# Patient Record
Sex: Female | Born: 1995 | Race: Black or African American | Hispanic: No | Marital: Single | Smoking: Never smoker
Health system: Southern US, Community
[De-identification: ages and names within clinical notes are randomized; demographics above are authoritative.]

## PROBLEM LIST (undated history)

## (undated) DIAGNOSIS — R569 Unspecified convulsions: Secondary | ICD-10-CM

## (undated) DIAGNOSIS — L709 Acne, unspecified: Secondary | ICD-10-CM

---
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
## 2015-03-16 ENCOUNTER — Other Ambulatory Visit: Payer: Self-pay

## 2015-04-29 ENCOUNTER — Other Ambulatory Visit: Payer: Self-pay | Admitting: Sports Medicine

## 2015-04-29 DIAGNOSIS — M25562 Pain in left knee: Secondary | ICD-10-CM

## 2015-05-17 ENCOUNTER — Ambulatory Visit
Admission: RE | Admit: 2015-05-17 | Discharge: 2015-05-17 | Disposition: A | Discharge status: Home / Self Care | Payer: Medicaid Other | Source: Ambulatory Visit | Attending: Sports Medicine | Admitting: Sports Medicine

## 2015-05-17 DIAGNOSIS — M25562 Pain in left knee: Secondary | ICD-10-CM

## 2015-05-21 ENCOUNTER — Emergency Department (HOSPITAL_COMMUNITY): Payer: Medicaid Other

## 2015-05-21 ENCOUNTER — Emergency Department (HOSPITAL_COMMUNITY)
Admission: EM | Admit: 2015-05-21 | Discharge: 2015-05-21 | Disposition: A | Discharge status: Home / Self Care | Payer: Medicaid Other | Attending: Emergency Medicine | Admitting: Emergency Medicine

## 2015-05-21 ENCOUNTER — Encounter (HOSPITAL_COMMUNITY): Payer: Self-pay | Admitting: *Deleted

## 2015-05-21 DIAGNOSIS — Z872 Personal history of diseases of the skin and subcutaneous tissue: Secondary | ICD-10-CM | POA: Insufficient documentation

## 2015-05-21 DIAGNOSIS — Z3202 Encounter for pregnancy test, result negative: Secondary | ICD-10-CM | POA: Diagnosis not present

## 2015-05-21 DIAGNOSIS — R091 Pleurisy: Secondary | ICD-10-CM | POA: Diagnosis not present

## 2015-05-21 DIAGNOSIS — M546 Pain in thoracic spine: Secondary | ICD-10-CM | POA: Diagnosis present

## 2015-05-21 LAB — POC URINE PREG, ED: PREG TEST UR: NEGATIVE

## 2015-05-21 MED ORDER — DICLOFENAC SODIUM 75 MG PO TBEC: 75.0000 mg | DELAYED_RELEASE_TABLET | Freq: Two times a day (BID) | ORAL | Status: DC

## 2015-05-21 MED ORDER — CYCLOBENZAPRINE HCL 10 MG PO TABS: 10.0000 mg | ORAL_TABLET | Freq: Three times a day (TID) | ORAL | Status: DC | PRN

## 2015-05-21 MED ORDER — HYDROCODONE-ACETAMINOPHEN 5-325 MG PO TABS: 2.0000 | ORAL_TABLET | ORAL | Status: DC | PRN

## 2015-05-21 NOTE — ED Provider Notes (Signed)
CSN: [id number]     Arrival date & time 05/21/15  1328 History   First MD Initiated Contact with Patient 05/21/15 1444     Chief Complaint  Patient presents with  . Back Pain     (Consider location/radiation/quality/duration/timing/severity/associated sxs/prior Treatment) Patient is a 19 y.o. female presenting with back pain. The history is provided by the patient. No language interpreter was used.  Back Pain Location:  Generalized Quality:  Aching Radiates to:  Does not radiate Pain severity:  Moderate Onset quality:  Gradual Duration:  1 week Timing:  Constant Progression:  Worsening Chronicity:  New Context: not emotional stress   Relieved by:  Nothing Worsened by:  Deep breathing and movement Associated symptoms: no abdominal pain   Risk factors: not pregnant and no recent surgery     Past Medical History  Diagnosis Date  . Acne   . Seizures (HCC)     Unknown for dx x 1   History reviewed. No pertinent past surgical history. Family History  Problem Relation Age of Onset  . Hypertension Mother   . Pneumonia Father   . Cancer Paternal Grandmother     breast cancer   Social History  Substance Use Topics  . Smoking status: Never Smoker   . Smokeless tobacco: None  . Alcohol Use: No   OB History    Gravida Para Term Preterm AB TAB SAB Ectopic Multiple Living   0 0 0 0 0 0 0 0       Review of Systems  Gastrointestinal: Negative for abdominal pain.  Musculoskeletal: Positive for back pain.  All other systems reviewed and are negative.     Allergies  Peanut oil; Solodyn; and Peanuts  Home Medications   Prior to Admission medications   Medication Sig Start Date End Date Taking? Authorizing Provider  ibuprofen (ADVIL,MOTRIN) 200 MG tablet Take 200-400 mg by mouth every 6 (six) hours as needed for headache, mild pain or moderate pain.   Yes Historical Provider, MD  cyclobenzaprine (FLEXERIL) 10 MG tablet Take 1 tablet (10 mg total) by mouth 3 (three)  times daily as needed for muscle spasms. 05/21/15   Elson AreasLeslie K Sofia, PA-C  diclofenac (VOLTAREN) 75 MG EC tablet Take 1 tablet (75 mg total) by mouth 2 (two) times daily. 05/21/15   Elson AreasLeslie K Sofia, PA-C  HYDROcodone-acetaminophen (NORCO/VICODIN) 5-325 MG tablet Take 2 tablets by mouth every 4 (four) hours as needed. 05/21/15   Lonia SkinnerLeslie K Sofia, PA-C   BP 135/74 mmHg  Pulse 106  Temp(Src) 98.3 F (36.8 C) (Oral)  Resp 16  Ht 5\' 9"  (1.753 m)  Wt 149 lb (67.586 kg)  BMI 21.99 kg/m2  SpO2 100%  LMP 04/30/2015 Physical Exam  Constitutional: She is oriented to person, place, and time. She appears well-developed and well-nourished.  HENT:  Head: Normocephalic and atraumatic.  Eyes: Conjunctivae and EOM are normal. Pupils are equal, round, and reactive to light.  Neck: Normal range of motion.  Cardiovascular: Normal rate and normal heart sounds.   Pulmonary/Chest: Effort normal and breath sounds normal.  Abdominal: Soft. She exhibits no distension.  Musculoskeletal:  Tender left upper back, scapula and upper back,  Full range of motion neck,   Neck nontender,   Pain with movement of left shoulder.   Neurological: She is alert and oriented to person, place, and time.  Psychiatric: She has a normal mood and affect.  Nursing note and vitals reviewed.   ED Course  Procedures (including critical care time)  Labs Review Labs Reviewed  POC URINE PREG, ED    Imaging Review No results found. I have personally reviewed and evaluated these images and lab results as part of my medical decision-making.   EKG Interpretation None      MDM  Pt has had a recent cough,   I suspect pain may be pleuritic.     Final diagnoses:  Pleurisy    voltaren Hydrocodone Return if any problems. See your Physicain for recheck if symptoms persist    Elson Areas, PA-C 05/21/15 1555  Glynn Octave, MD 05/21/15 [phone]

## 2015-05-21 NOTE — Discharge Instructions (Signed)
Pleurisy Pleurisy is an inflammation and swelling of the lining of the lungs (pleura). Because of this inflammation, it hurts to breathe. It can be aggravated by coughing, laughing, or deep breathing. Pleurisy is often caused by an underlying infection or disease.  HOME CARE INSTRUCTIONS  Monitor your pleurisy for any changes. The following actions may help to alleviate any discomfort you are experiencing:  Medicine may help with pain. Only take over-the-counter or prescription medicines for pain, discomfort, or fever as directed by your health care provider.  Only take antibiotic medicine as directed. Make sure to finish it even if you start to feel better. SEEK MEDICAL CARE IF:   Your pain is not controlled with medicine or is increasing.  You have an increase in pus-like (purulent) secretions brought up with coughing. SEEK IMMEDIATE MEDICAL CARE IF:   You have blue or dark lips, fingernails, or toenails.  You are coughing up blood.  You have increased difficulty breathing.  You have continuing pain unrelieved by medicine or pain lasting more than 1 week.  You have pain that radiates into your neck, arms, or jaw.  You develop increased shortness of breath or wheezing.  You develop a fever, rash, vomiting, fainting, or other serious symptoms. MAKE SURE YOU:  Understand these instructions.   Will watch your condition.   Will get help right away if you are not doing well or get worse.    This information is not intended to replace advice given to you by your health care provider. Make sure you discuss any questions you have with your health care provider.   Document Released: 07/10/2005 Document Revised: 03/12/2013 Document Reviewed: 12/22/2012 Elsevier Interactive Patient Education 2016 [employer] Back Pain, Adult Back pain is very common in adults.The cause of back pain is rarely dangerous and the pain often gets better over time.The cause of your back pain may  not be known. Some common causes of back pain include:  Strain of the muscles or ligaments supporting the spine.  Wear and tear (degeneration) of the spinal disks.  Arthritis.  Direct injury to the back. For many people, back pain may return. Since back pain is rarely dangerous, most people can learn to manage this condition on their own. HOME CARE INSTRUCTIONS Watch your back pain for any changes. The following actions may help to lessen any discomfort you are feeling:  Remain active. It is stressful on your back to sit or stand in one place for long periods of time. Do not sit, drive, or stand in one place for more than 30 minutes at a time. Take short walks on even surfaces as soon as you are able.Try to increase the length of time you walk each day.  Exercise regularly as directed by your health care provider. Exercise helps your back heal faster. It also helps avoid future injury by keeping your muscles strong and flexible.  Do not stay in bed.Resting more than 1-2 days can delay your recovery.  Pay attention to your body when you bend and lift. The most comfortable positions are those that put less stress on your recovering back. Always use proper lifting techniques, including:  Bending your knees.  Keeping the load close to your body.  Avoiding twisting.  Find a comfortable position to sleep. Use a firm mattress and lie on your side with your knees slightly bent. If you lie on your back, put a pillow under your knees.  Avoid feeling anxious or stressed.Stress increases muscle tension and  can worsen back pain.It is important to recognize when you are anxious or stressed and learn ways to manage it, such as with exercise.  Take medicines only as directed by your health care provider. Over-the-counter medicines to reduce pain and inflammation are often the most helpful.Your health care provider may prescribe muscle relaxant drugs.These medicines help dull your pain so you can  more quickly return to your normal activities and healthy exercise.  Apply ice to the injured area:  Put ice in a plastic bag.  Place a towel between your skin and the bag.  Leave the ice on for 20 minutes, 2-3 times a day for the first 2-3 days. After that, ice and heat may be alternated to reduce pain and spasms.  Maintain a healthy weight. Excess weight puts extra stress on your back and makes it difficult to maintain good posture. SEEK MEDICAL CARE IF:  You have pain that is not relieved with rest or medicine.  You have increasing pain going down into the legs or buttocks.  You have pain that does not improve in one week.  You have night pain.  You lose weight.  You have a fever or chills. SEEK IMMEDIATE MEDICAL CARE IF:   You develop new bowel or bladder control problems.  You have unusual weakness or numbness in your arms or legs.  You develop nausea or vomiting.  You develop abdominal pain.  You feel faint.   This information is not intended to replace advice given to you by your health care provider. Make sure you discuss any questions you have with your health care provider.   Document Released: 07/10/2005 Document Revised: 07/31/2014 Document Reviewed: 11/11/2013 Elsevier Interactive Patient Education [employer]

## 2015-05-21 NOTE — ED Notes (Signed)
Left upper shoulder /back pain after having cough and cold symptoms last week. Movement makes the pain worse.

## 2016-08-27 IMAGING — DX DG CHEST 2V
3 series · 3 of 3 positions shown · non-contrast
Comparison: None.

CLINICAL DATA: Cough 1 week worsening.

EXAM:
CHEST  2 VIEW

[chest pa]
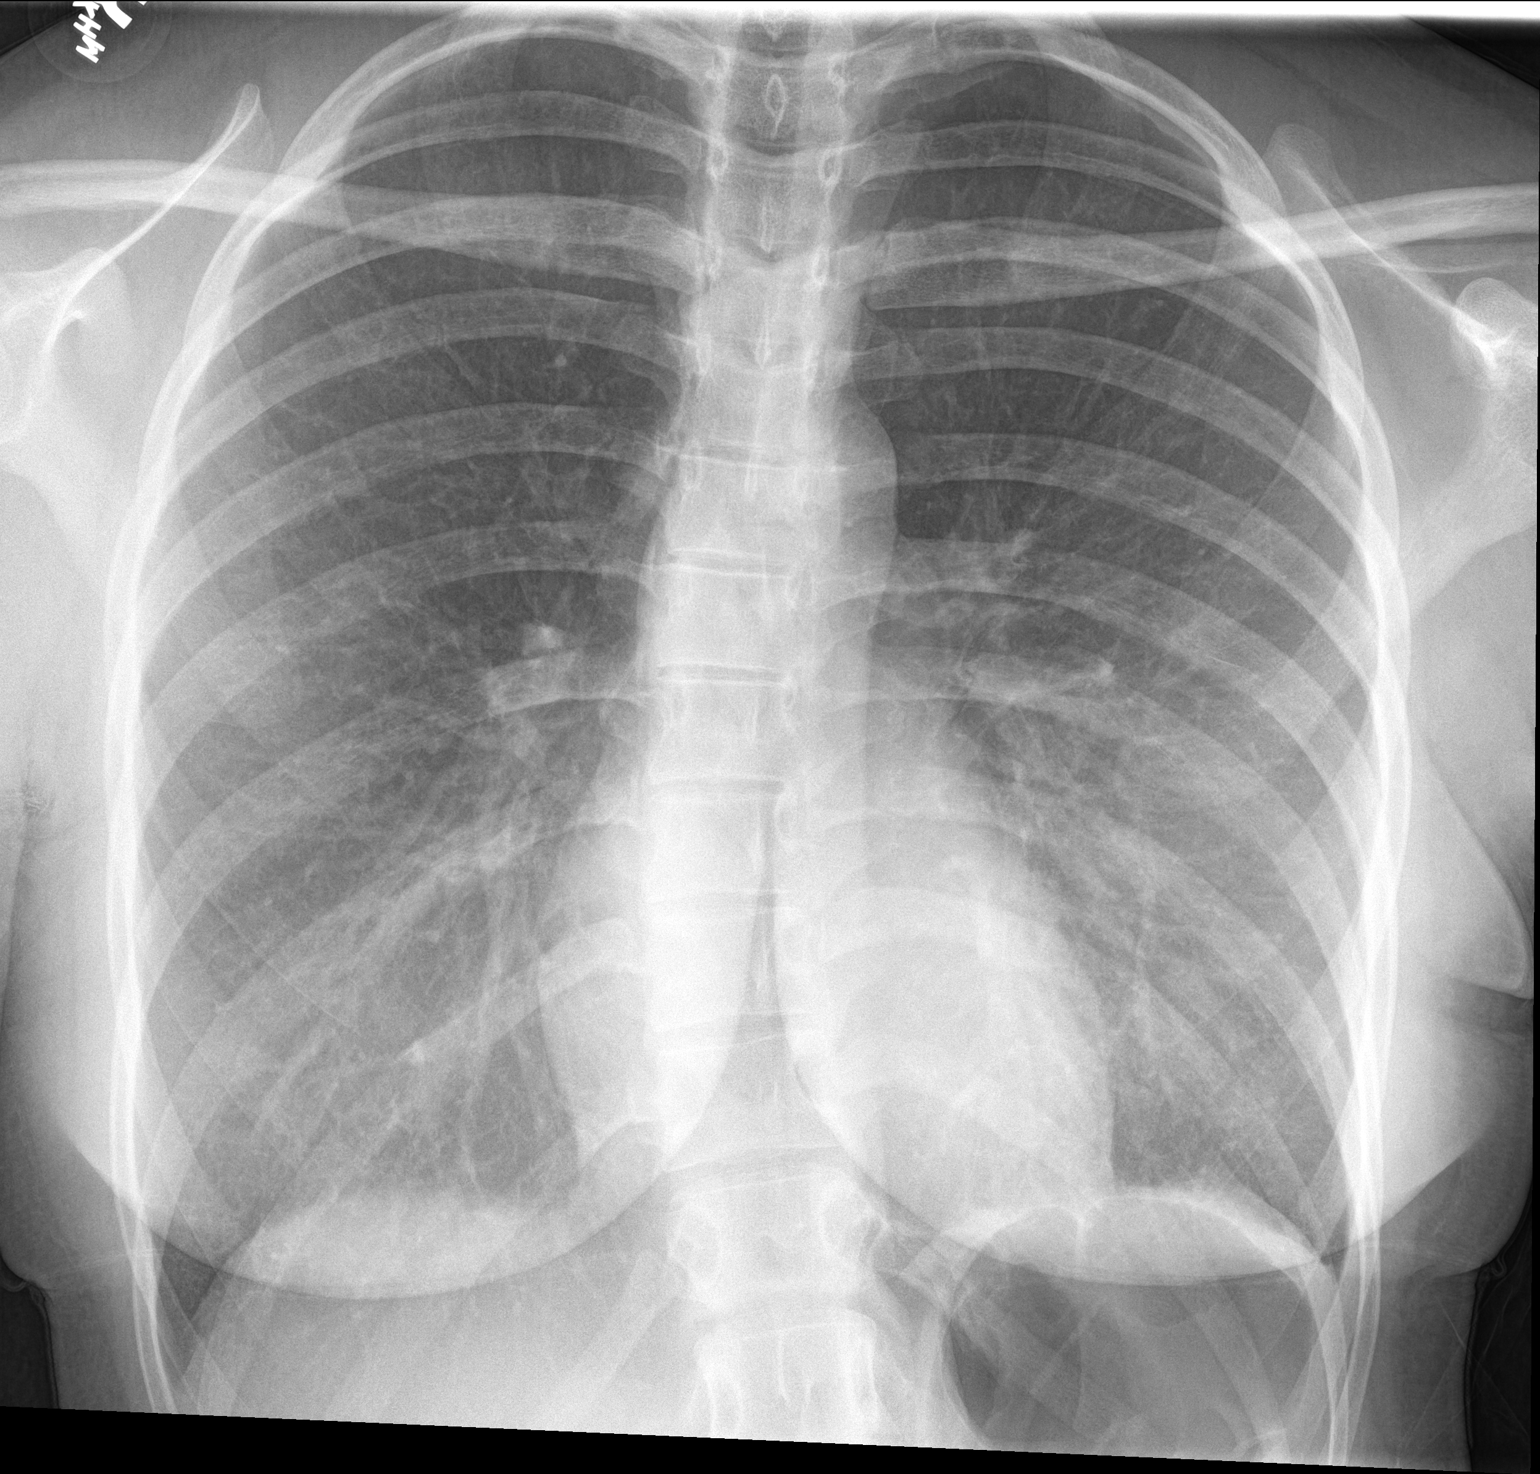

[chest lat (1 of 2)]
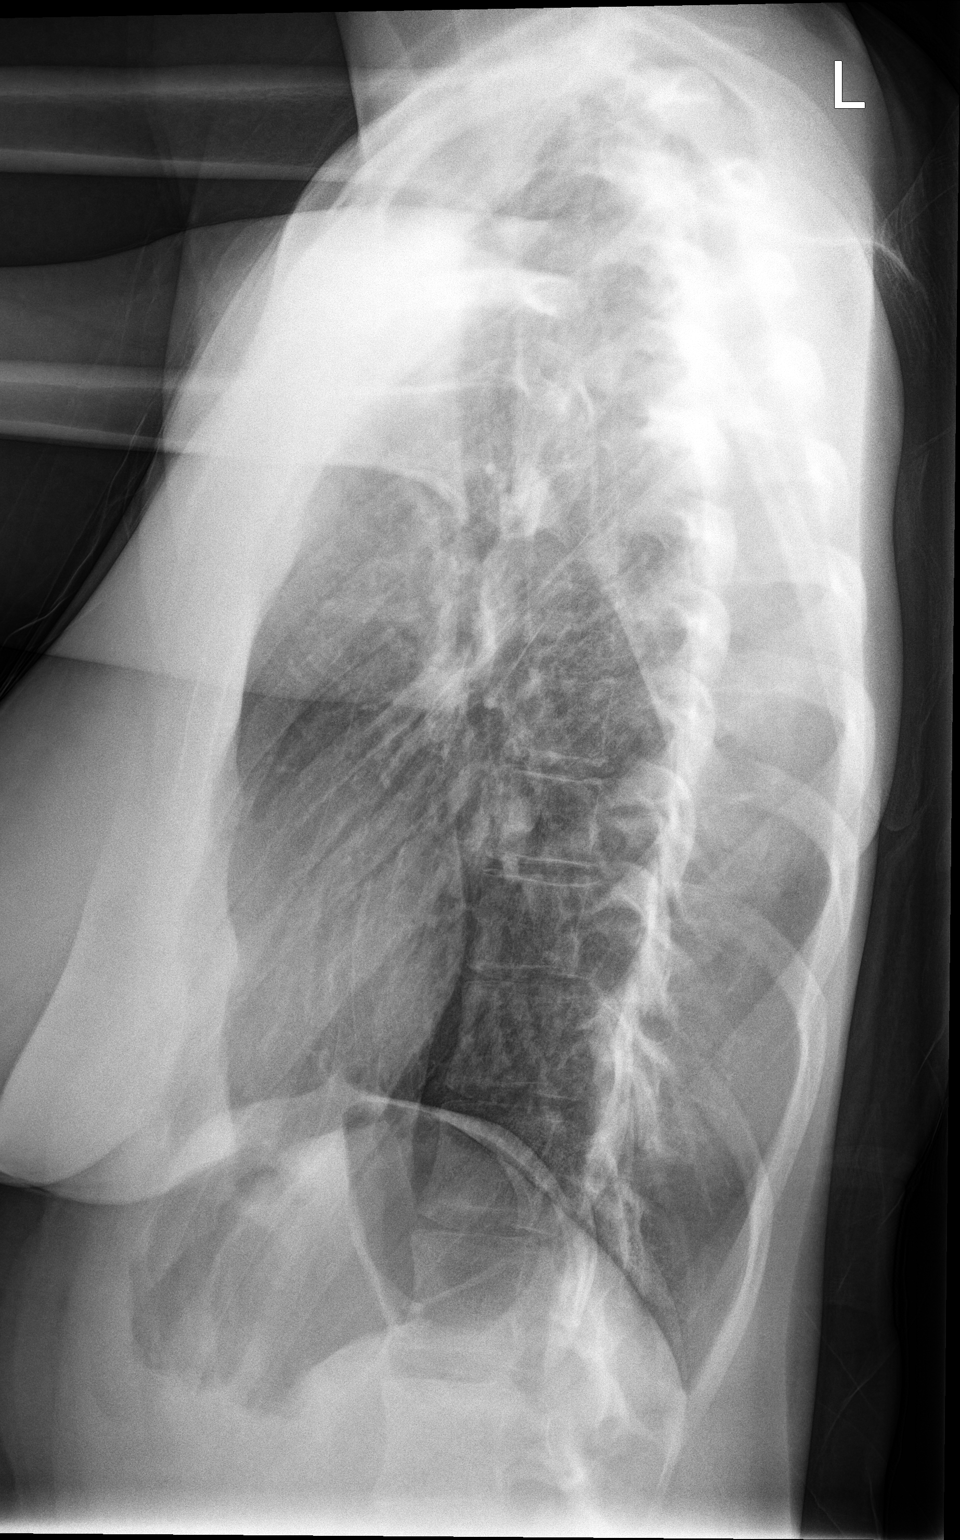

[chest lat (2 of 2)]
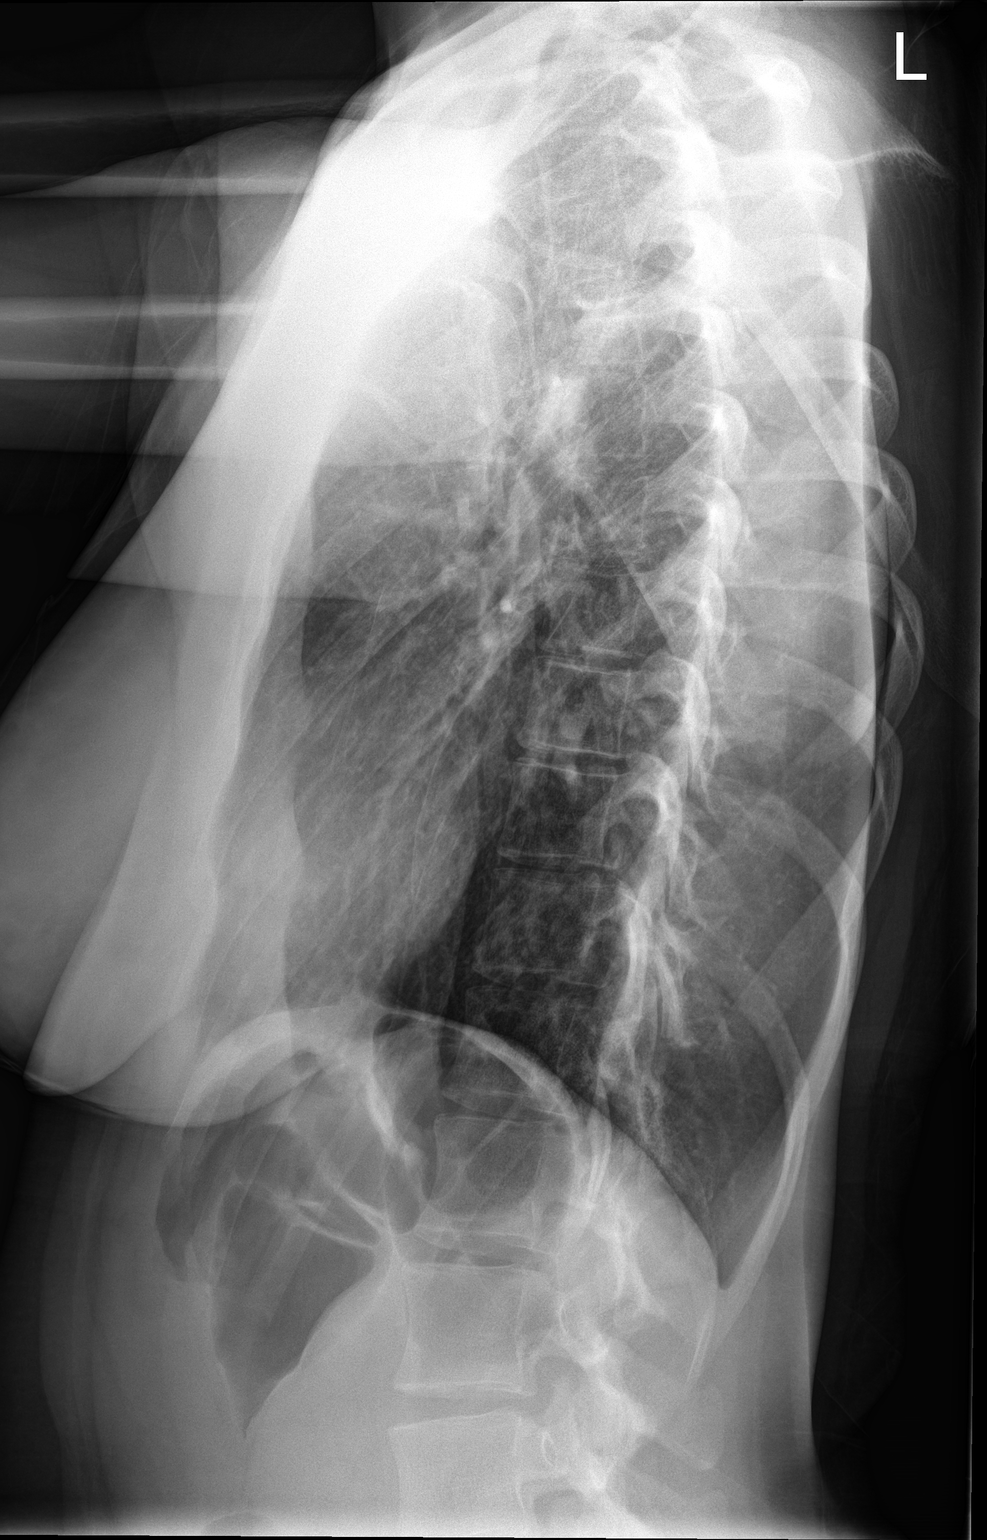

[3 of 3 positions shown; findings below may reference images not displayed]

FINDINGS: Lungs are hyperexpanded without focal consolidation or effusion.
Cardiomediastinal silhouette is within normal. Subtle curvature of
the thoracic spine convex right.
IMPRESSION: No active cardiopulmonary disease.

## 2017-08-17 ENCOUNTER — Ambulatory Visit: Payer: BLUE CROSS/BLUE SHIELD | Admitting: Obstetrics & Gynecology

## 2017-08-17 ENCOUNTER — Other Ambulatory Visit (HOSPITAL_COMMUNITY)
Admission: RE | Admit: 2017-08-17 | Discharge: 2017-08-17 | Disposition: A | Discharge status: Home / Self Care | Payer: BLUE CROSS/BLUE SHIELD | Source: Ambulatory Visit | Attending: Obstetrics & Gynecology | Admitting: Obstetrics & Gynecology

## 2017-08-17 ENCOUNTER — Encounter: Payer: Self-pay | Admitting: Obstetrics & Gynecology

## 2017-08-17 VITALS — BP 110/80 | HR 96 | Ht 69.0 in | Wt 149.0 lb

## 2017-08-17 DIAGNOSIS — Z01419 Encounter for gynecological examination (general) (routine) without abnormal findings: Secondary | ICD-10-CM | POA: Insufficient documentation

## 2017-08-17 NOTE — Progress Notes (Signed)
Subjective:     Sharon Baxter is a 22 y.o. female here for a routine exam.  Patient's last menstrual period was 08/09/2017. No obstetric history on file. Birth Control Method:  none Menstrual Calendar(currently): regular  Current complaints: none.   Current acute medical issues:  none   Recent Gynecologic History Patient's last menstrual period was 08/09/2017. Last Pap: today,   Last mammogram: n/a,    Past Medical History:  Diagnosis Date  . Acne   . Seizures (HCC)    Unknown for dx x 1    History reviewed. No pertinent surgical history.  OB History    Gravida Para Term Preterm AB Living   0 0 0 0 0     SAB TAB Ectopic Multiple Live Births   0 0 0          Social History   Socioeconomic History  . Marital status: Single    Spouse name: None  . Number of children: None  . Years of education: None  . Highest education level: None  Social Needs  . Financial resource strain: None  . Food insecurity - worry: None  . Food insecurity - inability: None  . Transportation needs - medical: None  . Transportation needs - non-medical: None  Occupational History  . None  Tobacco Use  . Smoking status: Never Smoker  . Smokeless tobacco: Never Used  Substance and Sexual Activity  . Alcohol use: No  . Drug use: No  . Sexual activity: No  Other Topics Concern  . None  Social History Narrative   ** Merged History Encounter **        Family History  Problem Relation Age of Onset  . Hypertension Mother   . Pneumonia Father   . Cancer Paternal Grandmother        breast cancer     Current Outpatient Medications:  .  diclofenac (VOLTAREN) 75 MG EC tablet, Take 1 tablet (75 mg total) by mouth 2 (two) times daily. (Patient not taking: Reported on 08/17/2017), Disp: 20 tablet, Rfl: 0 .  HYDROcodone-acetaminophen (NORCO/VICODIN) 5-325 MG tablet, Take 2 tablets by mouth every 4 (four) hours as needed. (Patient not taking: Reported on 08/17/2017), Disp: 10 tablet, Rfl:  0 .  ibuprofen (ADVIL,MOTRIN) 200 MG tablet, Take 200-400 mg by mouth every 6 (six) hours as needed for headache, mild pain or moderate pain., Disp: , Rfl:   Review of Systems  Review of Systems  Constitutional: Negative for fever, chills, weight loss, malaise/fatigue and diaphoresis.  HENT: Negative for hearing loss, ear pain, nosebleeds, congestion, sore throat, neck pain, tinnitus and ear discharge.   Eyes: Negative for blurred vision, double vision, photophobia, pain, discharge and redness.  Respiratory: Negative for cough, hemoptysis, sputum production, shortness of breath, wheezing and stridor.   Cardiovascular: Negative for chest pain, palpitations, orthopnea, claudication, leg swelling and PND.  Gastrointestinal: negative for abdominal pain. Negative for heartburn, nausea, vomiting, diarrhea, constipation, blood in stool and melena.  Genitourinary: Negative for dysuria, urgency, frequency, hematuria and flank pain.  Musculoskeletal: Negative for myalgias, back pain, joint pain and falls.  Skin: Negative for itching and rash.  Neurological: Negative for dizziness, tingling, tremors, sensory change, speech change, focal weakness, seizures, loss of consciousness, weakness and headaches.  Endo/Heme/Allergies: Negative for environmental allergies and polydipsia. Does not bruise/bleed easily.  Psychiatric/Behavioral: Negative for depression, suicidal ideas, hallucinations, memory loss and substance abuse. The patient is not nervous/anxious and does not have insomnia.  Objective:  Blood pressure 110/80, pulse 96, height 5\' 9"  (1.753 m), weight 149 lb (67.6 kg), last menstrual period 08/09/2017.   Physical Exam  Vitals reviewed. Constitutional: She is oriented to person, place, and time. She appears well-developed and well-nourished.  HENT:  Head: Normocephalic and atraumatic.        Right Ear: External ear normal.  Left Ear: External ear normal.  Nose: Nose normal.   Mouth/Throat: Oropharynx is clear and moist.  Eyes: Conjunctivae and EOM are normal. Pupils are equal, round, and reactive to light. Right eye exhibits no discharge. Left eye exhibits no discharge. No scleral icterus.  Neck: Normal range of motion. Neck supple. No tracheal deviation present. No thyromegaly present.  Cardiovascular: Normal rate, regular rhythm, normal heart sounds and intact distal pulses.  Exam reveals no gallop and no friction rub.   No murmur heard. Respiratory: Effort normal and breath sounds normal. No respiratory distress. She has no wheezes. She has no rales. She exhibits no tenderness.  GI: Soft. Bowel sounds are normal. She exhibits no distension and no mass. There is no tenderness. There is no rebound and no guarding.  Genitourinary:  Breasts no masses skin changes or nipple changes bilaterally      Vulva is normal without lesions Vagina is pink moist without discharge Cervix normal in appearance and pap is done Uterus is normal size shape and contour Adnexa is negative with normal sized ovaries   Musculoskeletal: Normal range of motion. She exhibits no edema and no tenderness.  Neurological: She is alert and oriented to person, place, and time. She has normal reflexes. She displays normal reflexes. No cranial nerve deficit. She exhibits normal muscle tone. Coordination normal.  Skin: Skin is warm and dry. No rash noted. No erythema. No pallor.  Psychiatric: She has a normal mood and affect. Her behavior is normal. Judgment and thought content normal.       Medications Ordered at today's visit: No orders of the defined types were placed in this encounter.   Other orders placed at today's visit: No orders of the defined types were placed in this encounter.     Assessment:    Healthy female exam.    Plan:    Contraception: none. Follow up in: 3 years.     Return in about 3 years (around 08/17/2020) for yearly, with Dr Despina HiddenEure.

## 2017-08-17 NOTE — Addendum Note (Signed)
Addended by: Federico FlakeNES, PEGGY A on: 08/17/2017 11:08 AM   Modules accepted: Orders

## 2017-08-23 LAB — CYTOLOGY - PAP
Chlamydia: NEGATIVE
Diagnosis: UNDETERMINED — AB
HPV (WINDOPATH): NOT DETECTED
NEISSERIA GONORRHEA: NEGATIVE

## 2018-10-02 ENCOUNTER — Encounter: Payer: Self-pay | Admitting: *Deleted

## 2018-10-04 ENCOUNTER — Other Ambulatory Visit: Payer: Self-pay

## 2018-10-04 ENCOUNTER — Ambulatory Visit: Payer: BLUE CROSS/BLUE SHIELD | Admitting: Diagnostic Neuroimaging

## 2018-10-04 ENCOUNTER — Encounter: Payer: Self-pay | Admitting: Diagnostic Neuroimaging

## 2018-10-04 VITALS — BP 142/84 | HR 124 | Ht 70.0 in | Wt 146.4 lb

## 2018-10-04 DIAGNOSIS — R251 Tremor, unspecified: Secondary | ICD-10-CM

## 2018-10-04 DIAGNOSIS — R21 Rash and other nonspecific skin eruption: Secondary | ICD-10-CM

## 2018-10-04 DIAGNOSIS — R269 Unspecified abnormalities of gait and mobility: Secondary | ICD-10-CM

## 2018-10-04 NOTE — Patient Instructions (Signed)
-   check MRI brain and cervical spine (with and without)  - check lab testing  - establish with PCP  - consider rheumatology and dermatology evaluations

## 2018-10-04 NOTE — Progress Notes (Signed)
[facility]  PATIENT: Sharon Baxter DOB: October 03, 1995  REFERRING CLINICIAN: A Kendall HISTORY FROM: patient and mother  REASON FOR VISIT: new consult    HISTORICAL  CHIEF COMPLAINT:  Chief Complaint  Patient presents with  . Pain    rm 7, New Pt, mom- French Ana, "pain and tremors in my hands and legs since 2015, happens daily"  . Tremors    HISTORY OF PRESENT ILLNESS:   23 year old female here for evaluation of gait and balance difficulty.  2015 patient was on acne medication minocycline and developed an allergic reaction with lip swelling and hives.  She was in the hospital for evaluation and was treated with prednisone and Benadryl.  She returned a week later for constellation of other symptoms of altered mental status, abnormal involuntary movements, gait difficulty.  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  She was referred to psychology and psychiatry.  Since that time symptoms have continued.  Patient continues to have intermittent tremor in her hands, intermittent shakiness in her legs.  She is also developed significant left knee pain.  She is also developed a different type of purple lacy type rash over her left leg and right leg.  Symptoms have continued since 2015 2016.  No further seizures or spells.   REVIEW OF SYSTEMS: Full 14 system review of systems performed and negative with exception of: Tremor joint pain.   ALLERGIES: Allergies  Allergen Reactions  . Peanut Oil Anaphylaxis  . Solodyn [Minocycline Hcl] Rash    Parents report pt had bumpy rash and swelling of joints  . Peanuts [Peanut Oil]     HOME MEDICATIONS: Outpatient Medications Prior to Visit  Medication Sig Dispense Refill  . ibuprofen (ADVIL,MOTRIN) 200 MG tablet Take 200-400 mg by mouth every 6 (six) hours as needed for headache, mild pain or moderate pain.    Marland Kitchen diclofenac (VOLTAREN) 75 MG EC tablet Take 1 tablet (75  mg total) by mouth 2 (two) times daily. (Patient not taking: Reported on 08/17/2017) 20 tablet 0  . HYDROcodone-acetaminophen (NORCO/VICODIN) 5-325 MG tablet Take 2 tablets by mouth every 4 (four) hours as needed. (Patient not taking: Reported on 08/17/2017) 10 tablet 0   No facility-administered medications prior to visit.     PAST MEDICAL HISTORY: Past Medical History:  Diagnosis Date  . Acne   . Seizures (HCC) 2015   Unknown for dx x 1    PAST SURGICAL HISTORY: No past surgical history on file.  FAMILY HISTORY: Family History  Problem Relation Age of Onset  . Hypertension Mother   . Pneumonia Father   . Cancer Paternal Grandmother        breast cancer    SOCIAL HISTORY: Social History   Socioeconomic History  . Marital status: Single    Spouse name: Not on file  . Number of children: 0  . Years of education: 69  . Highest education level: Not on file  Occupational History    Comment: NA  Social Needs  . Financial resource strain: Not on file  . Food insecurity:    Worry: Not on file    Inability: Not on file  . Transportation needs:    Medical: Not on file    Non-medical: Not on file  Tobacco Use  . Smoking status: Never Smoker  . Smokeless tobacco: Never Used  Substance and Sexual Activity  . Alcohol use: No  . Drug use: No  . Sexual activity: Never  Lifestyle  .  Physical activity:    Days per week: Not on file    Minutes per session: Not on file  . Stress: Not on file  Relationships  . Social connections:    Talks on phone: Not on file    Gets together: Not on file    Attends religious service: Not on file    Active member of club or organization: Not on file    Attends meetings of clubs or organizations: Not on file    Relationship status: Not on file  . Intimate partner violence:    Fear of current or ex partner: Not on file    Emotionally abused: Not on file    Physically abused: Not on file    Forced sexual activity: Not on file  Other  Topics Concern  . Not on file  Social History Narrative   Lives with parents    no caffeine     PHYSICAL EXAM  GENERAL EXAM/CONSTITUTIONAL: Vitals:  Vitals:   10/04/18 1109  BP: (!) 142/84  Pulse: (!) 124  Weight: 146 lb 6.4 oz (66.4 kg)  Height:  (1.778 m)     Body mass index is 21.01 kg/m. Wt Readings from Last 3 Encounters:  10/04/18 146 lb 6.4 oz (66.4 kg)  08/17/17 149 lb (67.6 kg)  05/21/15 149 lb (67.6 kg) (81 %, Z= 0.88)*   * Growth percentiles are based on CDC (Girls, 2-20 Years) data.     Patient is in no distress; well developed, nourished and groomed; neck is supple  LIVEDO RETICULARIS IN LEFT > RIGHT KNEE AND THIGH REGIONS  CARDIOVASCULAR:  Examination of carotid arteries is normal; no carotid bruits  Regular rate and rhythm, no murmurs  Examination of peripheral vascular system by observation and palpation is normal  EYES:  Ophthalmoscopic exam of optic discs and posterior segments is normal; no papilledema or hemorrhages  Visual Acuity Screening   Right eye Left eye Both eyes  Without correction: 20/20 20/20   With correction:        MUSCULOSKELETAL:  Gait, strength, tone, movements noted in Neurologic exam below  NEUROLOGIC: MENTAL STATUS:  No flowsheet data found.  awake, alert, oriented to person, place and time  recent and remote memory intact  normal attention and concentration  language fluent, comprehension intact, naming intact  fund of knowledge appropriate  CRANIAL NERVE:   2nd - no papilledema on fundoscopic exam  2nd, 3rd, 4th, 6th - pupils equal and reactive to light, visual fields full to confrontation, extraocular muscles intact, no nystagmus  5th - facial sensation symmetric  7th - facial strength symmetric  8th - hearing intact  9th - palate elevates symmetrically, uvula midline  11th - shoulder shrug symmetric  12th - tongue protrusion midline  MOTOR:   normal bulk and tone, full  strength in the BUE, BLE  SENSORY:   normal and symmetric to light touch, temperature, vibration  COORDINATION:   finger-nose-finger, fine finger movements normal  REFLEXES:   deep tendon reflexes 1+ and symmetric  GAIT/STATION:   narrow based gait; able to walk on toes, heels; romberg is negative     DIAGNOSTIC DATA (LABS, IMAGING, TESTING) - I reviewed patient records, labs, notes, testing and imaging myself where available.  Lab Results  Component Value Date   WBC 5.8 05/21/2014   HGB 13.5 05/21/2014   HCT 39.3 05/21/2014   MCV 85.1 05/21/2014   PLT 219 05/21/2014      Component Value Date/Time  NA 141 05/21/2014 1601   K 3.3 (L) 05/21/2014 1601   CL 105 05/21/2014 1601   CO2 22 05/21/2014 1601   GLUCOSE 69 (L) 05/21/2014 1601   BUN 8 05/21/2014 1601   CREATININE 0.90 05/21/2014 1601   CALCIUM 9.2 05/21/2014 1601   PROT 7.2 05/21/2014 1601   ALBUMIN 4.1 05/21/2014 1601   AST 28 05/21/2014 1601   ALT 16 05/21/2014 1601   ALKPHOS 68 05/21/2014 1601   BILITOT 0.4 05/21/2014 1601   GFRNONAA >90 05/21/2014 1601   GFRAA >90 05/21/2014 1601   No results found for: CHOL, HDL, LDLCALC, LDLDIRECT, TRIG, CHOLHDL No results found for: HFSF4E No results found for: VITAMINB12 Lab Results  Component Value Date   TSH 3.360 02/27/2014    02/27/14 EEG - EEG is unremarkable. Please note that normal EEG does not exclude epilepsy, clinical correlation is indicated. Please note that a normal EEG does not exclude epilepsy clinical correlation is indicated.  02/26/14 CT head  Negative noncontrast head CT.    ASSESSMENT AND PLAN  23 y.o. 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  Patient with persistent but intermittent tremor and gait abnormality symptoms.  Could represent ongoing problems with conversion reaction.  We will proceed with further work-up to rule out other  neurologic etiologies.   Ddx: conversion reaction, autoimmune, inflamm, neuromuscular dz  1. Gait difficulty   2. Rash   3. Tremor     PLAN:  - check MRI brain and cervical spine (with and without) --> rule out demyelinating disease - check lab testing - establish with PCP - consider rheumatology and dermatology evaluations  Orders Placed This Encounter  Procedures  . MR BRAIN W WO CONTRAST  . MR CERVICAL SPINE W WO CONTRAST  . CBC with Differential/Platelets  . Comprehensive metabolic panel  . ANA,IFA RA Diag Pnl w/rflx Tit/Patn  . Pan-ANCA  . Vitamin B12  . Hemoglobin A1c  . TSH  . CK  . Aldolase  . AChR Abs with Reflex to MuSK   Return pending test results, for pending if symptoms worsen or fail to improve.    Suanne Marker, MD 10/04/2018, 12:16 PM Certified in Neurology, Neurophysiology and Neuroimaging  [facility] [address], [address] [place], [place] [address] [phone]

## 2018-10-08 ENCOUNTER — Telehealth: Payer: Self-pay | Admitting: Diagnostic Neuroimaging

## 2018-10-08 NOTE — Telephone Encounter (Signed)
lvm for pt to call back about scheduling mri  [employer]BS Auth: [id number] (exp. 10/07/18 to 12/06/18) Auth: [id number] (exp. 10/07/18 to 12/06/18)

## 2018-10-10 ENCOUNTER — Telehealth: Payer: Self-pay | Admitting: Diagnostic Neuroimaging

## 2018-10-10 NOTE — Telephone Encounter (Signed)
I spoke to the patient she is scheduled for 10/16/18 at [facility].

## 2018-10-10 NOTE — Telephone Encounter (Signed)
I spoke to the patient she is scheduled for 10/16/18 at [facility].  °

## 2018-10-10 NOTE — Telephone Encounter (Signed)
Pt stated she received a call on Monday re: her results.  Pt is asking for a call

## 2018-10-14 NOTE — Telephone Encounter (Signed)
When changing the location [place] you have to do a whole new authorization. Brain Auth: [id number] (exp. 10/14/18 to 12/13/18) Cervical auth: [id number] (exp. 10/14/18 to 12/13/18)

## 2018-10-14 NOTE — Telephone Encounter (Signed)
I spoke to the patient and informed her due to our office protocol we will not have MRI's in the office for the time being. I informed her that I will send the orders to GI but they are booking about  4 weeks or so out.. she understands.

## 2018-10-14 NOTE — Telephone Encounter (Signed)
  Yes; 4 weeks ok. -VRP

## 2018-10-14 NOTE — Telephone Encounter (Signed)
Noted, thank you

## 2018-10-15 ENCOUNTER — Other Ambulatory Visit: Payer: Self-pay | Admitting: *Deleted

## 2018-10-15 DIAGNOSIS — R251 Tremor, unspecified: Secondary | ICD-10-CM

## 2018-10-15 DIAGNOSIS — R21 Rash and other nonspecific skin eruption: Secondary | ICD-10-CM

## 2018-10-15 DIAGNOSIS — R269 Unspecified abnormalities of gait and mobility: Secondary | ICD-10-CM

## 2018-10-15 NOTE — Telephone Encounter (Signed)
When you get a chance can you put a new MRI order in for the location for [facility] for them to schedule it.

## 2018-10-15 NOTE — Telephone Encounter (Signed)
New MRI orders for brain, cervical spine placed, to be scheduled in 4 weeks at [facility].

## 2018-10-16 ENCOUNTER — Other Ambulatory Visit: Payer: BLUE CROSS/BLUE SHIELD

## 2018-10-17 LAB — CBC WITH DIFFERENTIAL/PLATELET
BASOS ABS: 0 10*3/uL (ref 0.0–0.2)
Basos: 1 %
EOS (ABSOLUTE): 0 10*3/uL (ref 0.0–0.4)
Eos: 0 %
Hematocrit: 42.8 % (ref 34.0–46.6)
Hemoglobin: 14.3 g/dL (ref 11.1–15.9)
Immature Grans (Abs): 0 10*3/uL (ref 0.0–0.1)
Immature Granulocytes: 0 %
Lymphocytes Absolute: 1.3 10*3/uL (ref 0.7–3.1)
Lymphs: 26 %
MCH: 28.7 pg (ref 26.6–33.0)
MCHC: 33.4 g/dL (ref 31.5–35.7)
MCV: 86 fL (ref 79–97)
MONOS ABS: 0.3 10*3/uL (ref 0.1–0.9)
Monocytes: 6 %
Neutrophils Absolute: 3.3 10*3/uL (ref 1.4–7.0)
Neutrophils: 67 %
PLATELETS: 236 10*3/uL (ref 150–450)
RBC: 4.99 x10E6/uL (ref 3.77–5.28)
RDW: 13.1 % (ref 11.7–15.4)
WBC: 5 10*3/uL (ref 3.4–10.8)

## 2018-10-17 LAB — VITAMIN B12: VITAMIN B 12: 576 pg/mL (ref 232–1245)

## 2018-10-17 LAB — ACHR ABS WITH REFLEX TO MUSK: AChR Binding Ab, Serum: 0.24 nmol/L (ref 0.00–0.24)

## 2018-10-17 LAB — COMPREHENSIVE METABOLIC PANEL
ALK PHOS: 79 IU/L (ref 39–117)
ALT: 12 IU/L (ref 0–32)
AST: 16 IU/L (ref 0–40)
Albumin/Globulin Ratio: 1.7 (ref 1.2–2.2)
Albumin: 5 g/dL (ref 3.9–5.0)
BUN/Creatinine Ratio: 10 (ref 9–23)
BUN: 9 mg/dL (ref 6–20)
Bilirubin Total: 0.4 mg/dL (ref 0.0–1.2)
CO2: 21 mmol/L (ref 20–29)
CREATININE: 0.93 mg/dL (ref 0.57–1.00)
Calcium: 10.1 mg/dL (ref 8.7–10.2)
Chloride: 103 mmol/L (ref 96–106)
GFR calc Af Amer: 101 mL/min/{1.73_m2} (ref >59)
GFR calc non Af Amer: 88 mL/min/{1.73_m2} (ref >59)
GLUCOSE: 84 mg/dL (ref 65–99)
Globulin, Total: 2.9 g/dL (ref 1.5–4.5)
Potassium: 3.8 mmol/L (ref 3.5–5.2)
SODIUM: 139 mmol/L (ref 134–144)
Total Protein: 7.9 g/dL (ref 6.0–8.5)

## 2018-10-17 LAB — MUSK ANTIBODIES: MuSK Antibodies: <1 U/mL

## 2018-10-17 LAB — ANA,IFA RA DIAG PNL W/RFLX TIT/PATN
ANA TITER 1: NEGATIVE
Cyclic Citrullin Peptide Ab: 13 units (ref 0–19)
RHEUMATOID FACTOR: 14.5 [IU]/mL — AB (ref 0.0–13.9)

## 2018-10-17 LAB — PAN-ANCA
Atypical pANCA: <1:20 {titer}
C-ANCA: <1:20 {titer}
P-ANCA: <1:20 {titer}

## 2018-10-17 LAB — TSH: TSH: 1.84 u[IU]/mL (ref 0.450–4.500)

## 2018-10-17 LAB — HEMOGLOBIN A1C
Est. average glucose Bld gHb Est-mCnc: 103 mg/dL
Hgb A1c MFr Bld: 5.2 % (ref 4.8–5.6)

## 2018-10-17 LAB — ALDOLASE: Aldolase: 5.6 U/L (ref 3.3–10.3)

## 2018-10-17 LAB — CK: CK TOTAL: 78 U/L (ref 24–173)

## 2018-10-21 ENCOUNTER — Telehealth: Payer: Self-pay | Admitting: *Deleted

## 2018-10-21 NOTE — Telephone Encounter (Signed)
LVM informing the patient that her labs were normal labs except a borderline rheumatoid factor. Dr Marjory Lies stated it is likely non-specific because her other labs are normal. Advised she continue his current plan, reviewed per plan on last office note. Left number for any questions.Marland Kitchen

## 2018-11-04 ENCOUNTER — Other Ambulatory Visit: Payer: Self-pay

## 2018-11-04 ENCOUNTER — Ambulatory Visit
Admission: RE | Admit: 2018-11-04 | Discharge: 2018-11-04 | Disposition: A | Discharge status: Home / Self Care | Payer: BLUE CROSS/BLUE SHIELD | Source: Ambulatory Visit | Attending: Diagnostic Neuroimaging | Admitting: Diagnostic Neuroimaging

## 2018-11-04 DIAGNOSIS — R21 Rash and other nonspecific skin eruption: Secondary | ICD-10-CM

## 2018-11-04 DIAGNOSIS — R269 Unspecified abnormalities of gait and mobility: Secondary | ICD-10-CM

## 2018-11-04 DIAGNOSIS — R251 Tremor, unspecified: Secondary | ICD-10-CM

## 2018-11-04 MED ORDER — GADOBENATE DIMEGLUMINE 529 MG/ML IV SOLN
13.0000 mL | Freq: Once | INTRAVENOUS | Status: AC | PRN
  Administered 2018-11-04: 13 mL via INTRAVENOUS

## 2020-08-04 ENCOUNTER — Ambulatory Visit: Payer: BLUE CROSS/BLUE SHIELD | Admitting: Internal Medicine

## 2020-09-16 ENCOUNTER — Other Ambulatory Visit: Payer: Self-pay

## 2020-09-16 ENCOUNTER — Encounter: Payer: Self-pay | Admitting: Internal Medicine

## 2020-09-16 ENCOUNTER — Ambulatory Visit: Payer: BC Managed Care – PPO | Admitting: Internal Medicine

## 2020-09-16 VITALS — BP 120/70 | HR 102 | Temp 98.2°F | Resp 18 | Ht 70.0 in | Wt 142.6 lb

## 2020-09-16 DIAGNOSIS — Z0000 Encounter for general adult medical examination without abnormal findings: Secondary | ICD-10-CM | POA: Diagnosis not present

## 2020-09-16 LAB — LIPID PANEL
Cholesterol: 146 mg/dL (ref 0–200)
HDL: 51.4 mg/dL (ref >39.00)
LDL Cholesterol: 84 mg/dL (ref 0–99)
NonHDL: 94.15
Total CHOL/HDL Ratio: 3
Triglycerides: 49 mg/dL (ref 0.0–149.0)
VLDL: 9.8 mg/dL (ref 0.0–40.0)

## 2020-09-16 LAB — COMPREHENSIVE METABOLIC PANEL
ALT: 11 U/L (ref 0–35)
AST: 15 U/L (ref 0–37)
Albumin: 4.6 g/dL (ref 3.5–5.2)
Alkaline Phosphatase: 64 U/L (ref 39–117)
BUN: 14 mg/dL (ref 6–23)
CO2: 25 mEq/L (ref 19–32)
Calcium: 9.8 mg/dL (ref 8.4–10.5)
Chloride: 103 mEq/L (ref 96–112)
Creatinine, Ser: 0.89 mg/dL (ref 0.40–1.20)
GFR: 90.77 mL/min (ref >60.00)
Glucose, Bld: 91 mg/dL (ref 70–99)
Potassium: 4.4 mEq/L (ref 3.5–5.1)
Sodium: 136 mEq/L (ref 135–145)
Total Bilirubin: 0.6 mg/dL (ref 0.2–1.2)
Total Protein: 7.9 g/dL (ref 6.0–8.3)

## 2020-09-16 LAB — CBC
HCT: 42.9 % (ref 36.0–46.0)
Hemoglobin: 14.5 g/dL (ref 12.0–15.0)
MCHC: 33.7 g/dL (ref 30.0–36.0)
MCV: 85.8 fl (ref 78.0–100.0)
Platelets: 195 10*3/uL (ref 150.0–400.0)
RBC: 5.01 Mil/uL (ref 3.87–5.11)
RDW: 14.5 % (ref 11.5–15.5)
WBC: 9.3 10*3/uL (ref 4.0–10.5)

## 2020-09-16 NOTE — Patient Instructions (Signed)
Health Maintenance, Female Adopting a healthy lifestyle and getting preventive care are important in promoting health and wellness. Ask your health care provider about:  The right schedule for you to have regular tests and exams.  Things you can do on your own to prevent diseases and keep yourself healthy. What should I know about diet, weight, and exercise? Eat a healthy diet  Eat a diet that includes plenty of vegetables, fruits, low-fat dairy products, and lean protein.  Do not eat a lot of foods that are high in solid fats, added sugars, or sodium.   Maintain a healthy weight Body mass index (BMI) is used to identify weight problems. It estimates body fat based on height and weight. Your health care provider can help determine your BMI and help you achieve or maintain a healthy weight. Get regular exercise Get regular exercise. This is one of the most important things you can do for your health. Most adults should:  Exercise for at least 150 minutes each week. The exercise should increase your heart rate and make you sweat (moderate-intensity exercise).  Do strengthening exercises at least twice a week. This is in addition to the moderate-intensity exercise.  Spend less time sitting. Even light physical activity can be beneficial. Watch cholesterol and blood lipids Have your blood tested for lipids and cholesterol at 25 years of age, then have this test every 5 years. Have your cholesterol levels checked more often if:  Your lipid or cholesterol levels are high.  You are older than 25 years of age.  You are at high risk for heart disease. What should I know about cancer screening? Depending on your health history and family history, you may need to have cancer screening at various ages. This may include screening for:  Breast cancer.  Cervical cancer.  Colorectal cancer.  Skin cancer.  Lung cancer. What should I know about heart disease, diabetes, and high blood  pressure? Blood pressure and heart disease  High blood pressure causes heart disease and increases the risk of stroke. This is more likely to develop in people who have high blood pressure readings, are of African descent, or are overweight.  Have your blood pressure checked: ? Every 3-5 years if you are 18-39 years of age. ? Every year if you are 40 years old or older. Diabetes Have regular diabetes screenings. This checks your fasting blood sugar level. Have the screening done:  Once every three years after age 40 if you are at a normal weight and have a low risk for diabetes.  More often and at a younger age if you are overweight or have a high risk for diabetes. What should I know about preventing infection? Hepatitis B If you have a higher risk for hepatitis B, you should be screened for this virus. Talk with your health care provider to find out if you are at risk for hepatitis B infection. Hepatitis C Testing is recommended for:  Everyone born from 1945 through 1965.  Anyone with known risk factors for hepatitis C. Sexually transmitted infections (STIs)  Get screened for STIs, including gonorrhea and chlamydia, if: ? You are sexually active and are younger than 24 years of age. ? You are older than 24 years of age and your health care provider tells you that you are at risk for this type of infection. ? Your sexual activity has changed since you were last screened, and you are at increased risk for chlamydia or gonorrhea. Ask your health care provider   if you are at risk.  Ask your health care provider about whether you are at high risk for HIV. Your health care provider may recommend a prescription medicine to help prevent HIV infection. If you choose to take medicine to prevent HIV, you should first get tested for HIV. You should then be tested every 3 months for as long as you are taking the medicine. Pregnancy  If you are about to stop having your period (premenopausal) and  you may become pregnant, seek counseling before you get pregnant.  Take 400 to 800 micrograms (mcg) of folic acid every day if you become pregnant.  Ask for birth control (contraception) if you want to prevent pregnancy. Osteoporosis and menopause Osteoporosis is a disease in which the bones lose minerals and strength with aging. This can result in bone fractures. If you are 65 years old or older, or if you are at risk for osteoporosis and fractures, ask your health care provider if you should:  Be screened for bone loss.  Take a calcium or vitamin D supplement to lower your risk of fractures.  Be given hormone replacement therapy (HRT) to treat symptoms of menopause. Follow these instructions at home: Lifestyle  Do not use any products that contain nicotine or tobacco, such as cigarettes, e-cigarettes, and chewing tobacco. If you need help quitting, ask your health care provider.  Do not use street drugs.  Do not share needles.  Ask your health care provider for help if you need support or information about quitting drugs. Alcohol use  Do not drink alcohol if: ? Your health care provider tells you not to drink. ? You are pregnant, may be pregnant, or are planning to become pregnant.  If you drink alcohol: ? Limit how much you use to 0-1 drink a day. ? Limit intake if you are breastfeeding.  Be aware of how much alcohol is in your drink. In the [place] one drink equals one 12 oz bottle of beer (355 mL), one 5 oz glass of wine (148 mL), or one 1 oz glass of hard liquor (44 mL). General instructions  Schedule regular health, dental, and eye exams.  Stay current with your vaccines.  Tell your health care provider if: ? You often feel depressed. ? You have ever been abused or do not feel safe at home. Summary  Adopting a healthy lifestyle and getting preventive care are important in promoting health and wellness.  Follow your health care provider's instructions about healthy  diet, exercising, and getting tested or screened for diseases.  Follow your health care provider's instructions on monitoring your cholesterol and blood pressure. This information is not intended to replace advice given to you by your health care provider. Make sure you discuss any questions you have with your health care provider. Document Revised: 07/03/2018 Document Reviewed: 07/03/2018 Elsevier Patient Education  2021 Elsevier Inc.  

## 2020-09-16 NOTE — Progress Notes (Signed)
   Subjective:   Patient ID: Sharon Baxter Reasons, female    DOB: [birth year], 25 y.o.   MRN: [id number]  HPI The patient is a new 25 YO female coming in for physical.   PMH, FMH, social history reviewed and updated  Review of Systems  Constitutional: Negative.   HENT: Negative.   Eyes: Negative.   Respiratory: Negative for cough, chest tightness and shortness of breath.   Cardiovascular: Negative for chest pain, palpitations and leg swelling.  Gastrointestinal: Negative for abdominal distention, abdominal pain, constipation, diarrhea, nausea and vomiting.  Musculoskeletal: Negative.   Skin: Negative.   Neurological: Negative.   Psychiatric/Behavioral: Negative.     Objective:  Physical Exam Constitutional:      Appearance: She is well-developed and well-nourished.  HENT:     Head: Normocephalic and atraumatic.  Eyes:     Extraocular Movements: EOM normal.  Cardiovascular:     Rate and Rhythm: Normal rate and regular rhythm.  Pulmonary:     Effort: Pulmonary effort is normal. No respiratory distress.     Breath sounds: Normal breath sounds. No wheezing or rales.  Abdominal:     General: Bowel sounds are normal. There is no distension.     Palpations: Abdomen is soft.     Tenderness: There is no abdominal tenderness. There is no rebound.  Musculoskeletal:        General: No edema.     Cervical back: Normal range of motion.  Skin:    General: Skin is warm and dry.  Neurological:     Mental Status: She is alert and oriented to person, place, and time.     Coordination: Coordination normal.  Psychiatric:        Mood and Affect: Mood and affect normal.     Vitals:   09/16/20 0832  BP: 120/70  Pulse: (!) 102  Resp: 18  Temp: 98.2 F (36.8 C)  TempSrc: Oral  SpO2: 98%  Weight: 142 lb 9.6 oz (64.7 kg)  Height: 5\' 10"  (1.778 m)    This visit occurred during the SARS-CoV-2 public health emergency.  Safety protocols were in place, including screening questions  prior to the visit, additional usage of staff PPE, and extensive cleaning of exam room while observing appropriate contact time as indicated for disinfecting solutions.   Assessment & Plan:

## 2020-09-16 NOTE — Assessment & Plan Note (Signed)
Flu shot declines. Covid-19 not done and extensive counseling today. Tetanus up to date. Pap smear overdue and ASCUS on last pap smear, has gyn will schedule. Counseled about sun safety and mole surveillance. Counseled about the dangers of distracted driving. Given 10 year screening recommendations.

## 2021-01-07 ENCOUNTER — Encounter: Payer: Self-pay | Admitting: Internal Medicine

## 2021-01-07 ENCOUNTER — Telehealth (INDEPENDENT_AMBULATORY_CARE_PROVIDER_SITE_OTHER): Payer: BC Managed Care – PPO | Admitting: Internal Medicine

## 2021-01-07 DIAGNOSIS — U071 COVID-19: Secondary | ICD-10-CM | POA: Insufficient documentation

## 2021-01-07 MED ORDER — PROMETHAZINE-DM 6.25-15 MG/5ML PO SYRP: 5.0000 mL | ORAL_SOLUTION | Freq: Four times a day (QID) | ORAL | 0 refills | Status: DC | PRN

## 2021-01-07 NOTE — Assessment & Plan Note (Signed)
Rx promethazine/dm. Counseled about quarantine period and ability to get vaccinated once feeling well.

## 2021-01-07 NOTE — Progress Notes (Signed)
Virtual Visit via Video Note  I connected with Sharon Baxter on 01/07/21 at 11:20 AM EDT by a video enabled telemedicine application and verified that I am speaking with the correct person using two identifiers.  The patient and the provider were at separate locations throughout the entire encounter. Patient location: home, Provider location: work   I discussed the limitations of evaluation and management by telemedicine and the availability of in person appointments. The patient expressed understanding and agreed to proceed. The patient and the provider were the only parties present for the visit unless noted in HPI below.  History of Present Illness: The patient is a 25 y.o. female with visit for covid-19 positive. Not vaccinated. Started Monday with loss taste and smell as well as some cough and muscle aches. The muscle aches have improved some. Still coughing. Has tried otc. Denies fevers or chills or SOB. Overall it is improving some.   Observations/Objective: Appearance: normal, breathing appears normal, casual grooming, abdomen does not appear distended, throat not visualized well, mental status is A and O times 3  Assessment and Plan: See problem oriented charting  Follow Up Instructions: rx promethazine/dm cough syrup, otc as needed, can have covid-19 vaccine at least 1 week after symptoms resolve which she is interested in and encouraged to do  I discussed the assessment and treatment plan with the patient. The patient was provided an opportunity to ask questions and all were answered. The patient agreed with the plan and demonstrated an understanding of the instructions.   The patient was advised to call back or seek an in-person evaluation if the symptoms worsen or if the condition fails to improve as anticipated.  Myrlene Broker, MD

## 2022-04-26 ENCOUNTER — Ambulatory Visit: Payer: BC Managed Care – PPO | Admitting: Internal Medicine

## 2022-05-01 ENCOUNTER — Encounter: Payer: Self-pay | Admitting: Internal Medicine

## 2022-05-01 ENCOUNTER — Ambulatory Visit: Payer: BC Managed Care – PPO | Admitting: Internal Medicine

## 2022-05-01 VITALS — BP 122/84 | HR 118 | Temp 97.9°F | Ht 70.0 in | Wt 142.0 lb

## 2022-05-01 DIAGNOSIS — R1031 Right lower quadrant pain: Secondary | ICD-10-CM | POA: Diagnosis not present

## 2022-05-01 LAB — CBC
HCT: 41 % (ref 36.0–46.0)
Hemoglobin: 13.9 g/dL (ref 12.0–15.0)
MCHC: 33.9 g/dL (ref 30.0–36.0)
MCV: 86.8 fl (ref 78.0–100.0)
Platelets: 242 10*3/uL (ref 150.0–400.0)
RBC: 4.73 Mil/uL (ref 3.87–5.11)
RDW: 13.5 % (ref 11.5–15.5)
WBC: 5.9 10*3/uL (ref 4.0–10.5)

## 2022-05-01 LAB — COMPREHENSIVE METABOLIC PANEL
ALT: 9 U/L (ref 0–35)
AST: 16 U/L (ref 0–37)
Albumin: 4.6 g/dL (ref 3.5–5.2)
Alkaline Phosphatase: 61 U/L (ref 39–117)
BUN: 9 mg/dL (ref 6–23)
CO2: 26 mEq/L (ref 19–32)
Calcium: 10.1 mg/dL (ref 8.4–10.5)
Chloride: 106 mEq/L (ref 96–112)
Creatinine, Ser: 0.91 mg/dL (ref 0.40–1.20)
GFR: 87.38 mL/min (ref >60.00)
Glucose, Bld: 96 mg/dL (ref 70–99)
Potassium: 4 mEq/L (ref 3.5–5.1)
Sodium: 139 mEq/L (ref 135–145)
Total Bilirubin: 0.3 mg/dL (ref 0.2–1.2)
Total Protein: 8 g/dL (ref 6.0–8.3)

## 2022-05-01 LAB — LIPASE: Lipase: 23 U/L (ref 11.0–59.0)

## 2022-05-01 NOTE — Progress Notes (Signed)
   Subjective:   Patient ID: Sharon Baxter, female    DOB: August 12, 1995, 26 y.o.   MRN: [id number]  HPI The patient is a 26 YO female coming in for acute visit. 2 day history RLQ pain overall not worsening. Pain is 24/7. Not worse with bending or twisting. Has tried ibuprofen without relief. LMP 04/28/22  Review of Systems  Constitutional: Negative.   HENT: Negative.    Eyes: Negative.   Respiratory:  Negative for cough, chest tightness and shortness of breath.   Cardiovascular:  Negative for chest pain, palpitations and leg swelling.  Gastrointestinal:  Positive for abdominal pain. Negative for abdominal distention, constipation, diarrhea, nausea and vomiting.  Musculoskeletal: Negative.   Skin: Negative.   Neurological: Negative.   Psychiatric/Behavioral: Negative.      Objective:  Physical Exam Constitutional:      Appearance: She is well-developed.  HENT:     Head: Normocephalic and atraumatic.  Cardiovascular:     Rate and Rhythm: Normal rate and regular rhythm.  Pulmonary:     Effort: Pulmonary effort is normal. No respiratory distress.     Breath sounds: Normal breath sounds. No wheezing or rales.  Abdominal:     General: Bowel sounds are normal. There is no distension.     Palpations: Abdomen is soft.     Tenderness: There is abdominal tenderness in the right lower quadrant. There is no rebound.  Musculoskeletal:     Cervical back: Normal range of motion.  Skin:    General: Skin is warm and dry.  Neurological:     Mental Status: She is alert and oriented to person, place, and time.     Coordination: Coordination normal.     Vitals:   05/01/22 1350  BP: 122/84  Pulse: (!) 118  Temp: 97.9 F (36.6 C)  SpO2: 99%  Weight: 142 lb (64.4 kg)  Height: 5\' 10"  (1.778 m)    Assessment & Plan:

## 2022-05-01 NOTE — Patient Instructions (Signed)
We will check the labs and work on getting the scan. You will get a call to do the scan with instructions.

## 2022-05-01 NOTE — Assessment & Plan Note (Signed)
LMP 04/28/22 not pregnant. Pain 2 days not improving and needs CT scan to rule out appendicitis. Not acute abdomen currently and no peritoneal signs. Checking CBC, CMP, lipase to rule out alternate etiology. Could be ovarian cyst.

## 2022-05-03 ENCOUNTER — Ambulatory Visit
Admission: RE | Admit: 2022-05-03 | Discharge: 2022-05-03 | Disposition: A | Discharge status: Home / Self Care | Payer: BC Managed Care – PPO | Source: Ambulatory Visit | Attending: Internal Medicine | Admitting: Internal Medicine

## 2022-05-03 DIAGNOSIS — R1031 Right lower quadrant pain: Secondary | ICD-10-CM

## 2022-09-21 ENCOUNTER — Other Ambulatory Visit: Payer: Self-pay

## 2022-09-21 ENCOUNTER — Emergency Department (HOSPITAL_COMMUNITY)
Admission: EM | Admit: 2022-09-21 | Discharge: 2022-09-21 | Disposition: A | Discharge status: Home / Self Care | Payer: BC Managed Care – PPO | Attending: Emergency Medicine | Admitting: Emergency Medicine

## 2022-09-21 DIAGNOSIS — U071 COVID-19: Secondary | ICD-10-CM

## 2022-09-21 LAB — CBC WITH DIFFERENTIAL/PLATELET
Abs Immature Granulocytes: 0.01 K/uL (ref 0.00–0.07)
Basophils Absolute: 0 K/uL (ref 0.0–0.1)
Basophils Relative: 0 %
Eosinophils Absolute: 0 K/uL (ref 0.0–0.5)
Eosinophils Relative: 1 %
HCT: 41.7 % (ref 36.0–46.0)
Hemoglobin: 14.1 g/dL (ref 12.0–15.0)
Immature Granulocytes: 0 %
Lymphocytes Relative: 27 %
Lymphs Abs: 1.5 K/uL (ref 0.7–4.0)
MCH: 29 pg (ref 26.0–34.0)
MCHC: 33.8 g/dL (ref 30.0–36.0)
MCV: 85.8 fL (ref 80.0–100.0)
Monocytes Absolute: 0.2 K/uL (ref 0.1–1.0)
Monocytes Relative: 4 %
Neutro Abs: 3.7 K/uL (ref 1.7–7.7)
Neutrophils Relative %: 68 %
Platelets: 199 K/uL (ref 150–400)
RBC: 4.86 MIL/uL (ref 3.87–5.11)
RDW: 12.4 % (ref 11.5–15.5)
WBC: 5.4 K/uL (ref 4.0–10.5)
nRBC: 0 % (ref 0.0–0.2)

## 2022-09-21 LAB — BASIC METABOLIC PANEL
Anion gap: 9 (ref 5–15)
BUN: 7 mg/dL (ref 6–20)
CO2: 24 mmol/L (ref 22–32)
Calcium: 9.3 mg/dL (ref 8.9–10.3)
Chloride: 103 mmol/L (ref 98–111)
Creatinine, Ser: 0.85 mg/dL (ref 0.44–1.00)
GFR, Estimated: >60 mL/min (ref >60)
Glucose, Bld: 113 mg/dL — ABNORMAL HIGH (ref 70–99)
Potassium: 3.5 mmol/L (ref 3.5–5.1)
Sodium: 136 mmol/L (ref 135–145)

## 2022-09-21 LAB — RESP PANEL BY RT-PCR (RSV, FLU A&B, COVID)  RVPGX2
Influenza A by PCR: NEGATIVE
Influenza B by PCR: NEGATIVE
Resp Syncytial Virus by PCR: NEGATIVE
SARS Coronavirus 2 by RT PCR: POSITIVE — AB

## 2022-09-21 LAB — CBG MONITORING, ED: Glucose-Capillary: 106 mg/dL — ABNORMAL HIGH (ref 70–99)

## 2022-09-21 LAB — I-STAT BETA HCG BLOOD, ED (MC, WL, AP ONLY): I-stat hCG, quantitative: <5 m[IU]/mL (ref <5)

## 2022-09-21 LAB — GROUP A STREP BY PCR: Group A Strep by PCR: NOT DETECTED

## 2022-09-21 NOTE — ED Notes (Signed)
While doing ortho vs pt stated she was feeling dizzy and shaken while laying flat and sitting in chair position.

## 2022-09-21 NOTE — Discharge Instructions (Signed)
You were seen in the emergency department today for nausea, sore throat, lightheadedness.  You have COVID-19.  The rest of your labs are reassuring.  Please drink plenty of fluids, eat a gentle diet, rest and you may take Tylenol and Motrin for body aches or fever.  Please return for significantly worsening symptoms.

## 2022-09-21 NOTE — ED Provider Notes (Signed)
[facility] Provider Note   CSN: [id number] Arrival date & time: 09/21/22  1939     History  Chief Complaint  Patient presents with   Nausea    Sharon Baxter is a 27 y.o. female.  With past medical history of seizures who presents to the emergency department with faintness.  Patient states that on Monday and Tuesday she was having sore throat and pain with swallowing.  She states that she had been taking Robitussin and the symptoms seemed to have subsided a little, however over the past few days has felt lightheaded like she is going to pass out.  States if she feels slightly nauseated but has not had any vomiting.  She has been eating and drinking just mildly less than usual.  She denies having chest pain, palpitations, shortness of breath, headache, dizziness or visual changes.  She states that her family has been sick with viral upper respiratory infection.  HPI     Home Medications Prior to Admission medications   Not on File      Allergies    Peanut oil, Solodyn [minocycline hcl], and Peanuts [peanut oil]    Review of Systems   Review of Systems  HENT:  Positive for sore throat.   Neurological:  Positive for weakness and light-headedness.  All other systems reviewed and are negative.   Physical Exam Updated Vital Signs BP 113/72   Pulse 85   Temp 98.1 F (36.7 C) (Oral)   Resp 15   Ht '5\' 10"'$  (1.778 m)   Wt 65.8 kg   LMP 09/20/2022   SpO2 100%   BMI 20.81 kg/m  Physical Exam Vitals and nursing note reviewed.  Constitutional:      General: She is not in acute distress.    Appearance: Normal appearance. She is normal weight. She is not ill-appearing or toxic-appearing.  HENT:     Head: Normocephalic and atraumatic.     Mouth/Throat:     Mouth: Mucous membranes are moist.     Pharynx: Oropharynx is clear. Posterior oropharyngeal erythema present.  Eyes:     General: No scleral icterus.    Extraocular  Movements: Extraocular movements intact.     Pupils: Pupils are equal, round, and reactive to light.  Cardiovascular:     Rate and Rhythm: Normal rate and regular rhythm.     Pulses: Normal pulses.     Heart sounds: Normal heart sounds. No murmur heard. Pulmonary:     Effort: Pulmonary effort is normal. No respiratory distress.     Breath sounds: Normal breath sounds. No wheezing, rhonchi or rales.  Abdominal:     General: Bowel sounds are normal.     Palpations: Abdomen is soft.  Musculoskeletal:        General: Normal range of motion.  Skin:    General: Skin is warm and dry.     Capillary Refill: Capillary refill takes less than 2 seconds.  Neurological:     General: No focal deficit present.     Mental Status: She is alert and oriented to person, place, and time. Mental status is at baseline.  Psychiatric:        Mood and Affect: Mood normal.        Behavior: Behavior normal.        Thought Content: Thought content normal.        Judgment: Judgment normal.     ED Results / Procedures / Treatments  Labs (all labs ordered are listed, but only abnormal results are displayed) Labs Reviewed  RESP PANEL BY RT-PCR (RSV, FLU A&B, COVID)  RVPGX2 - Abnormal; Notable for the following components:      Result Value   SARS Coronavirus 2 by RT PCR POSITIVE (*)    All other components within normal limits  BASIC METABOLIC PANEL - Abnormal; Notable for the following components:   Glucose, Bld 113 (*)    All other components within normal limits  CBG MONITORING, ED - Abnormal; Notable for the following components:   Glucose-Capillary 106 (*)    All other components within normal limits  GROUP A STREP BY PCR  CBC WITH DIFFERENTIAL/PLATELET  I-STAT BETA HCG BLOOD, ED ([facility], [facility], [facility] ONLY)    EKG None  Radiology No results found.  Procedures Procedures   Medications Ordered in ED Medications - No data to display  ED Course/ Medical Decision Making/ A&P    Medical Decision  Making Amount and/or Complexity of Data Reviewed Labs: ordered.  Initial Impression and Ddx 27 year old female who presents to the emergency department with sore throat this week, lightheadedness.  Patient PMH that increases complexity of ED encounter:  none Differential: presyncope, arrhythmia, viral infection, etc.   Interpretation of Diagnostics I independent reviewed and interpreted the labs as followed: covid-19 positive; remainder of labs as above  - I independently visualized the following imaging with scope of interpretation limited to determining acute life threatening conditions related to emergency care: not indicated  Patient Reassessment and Ultimate Disposition/Management 27 year old female who presents to the emergency department with sore throat earlier this week and lightheadedness.  She is overall well appearing, non-septic, non-toxic appearing. Hemodynamically stable. Oropharynx with mild erythema without tonsillar swelling or exudates.  Given her presyncope symptoms I obtained an EKG, orthostatics and basic labs.  - not orthostatic - EKG without arrhythmia - no anemia, electrolyte dysfunction - not pregnant  - COVID-19 positive   Symptoms all likely related to viral infection. She is instructed to drink plenty of fluids, gentle diet, rest, nsaids or tylenol for fever or myalgias. Return precautions for worsening respiratory symptoms. She verbalized understanding.   The patient has been appropriately medically screened and/or stabilized in the ED. I have low suspicion for any other emergent medical condition which would require further screening, evaluation or treatment in the ED or require inpatient management. At time of discharge the patient is hemodynamically stable and in no acute distress. I have discussed work-up results and diagnosis with patient and answered all questions. Patient is agreeable with discharge plan. We discussed strict return precautions for  returning to the emergency department and they verbalized understanding.     Patient management required discussion with the following services or consulting groups:  None  Complexity of Problems Addressed Acute complicated illness or Injury  Additional Data Reviewed and Analyzed Further history obtained from: Further history from spouse/family member, Past medical history and medications listed in the EMR, and Care Everywhere  Patient Encounter Risk Assessment None  Final Clinical Impression(s) / ED Diagnoses Final diagnoses:  COVID-19    Rx / DC Orders ED Discharge Orders     None         Mickie Hillier, PA-C 09/21/22 2236    Davonna Belling, MD 09/21/22 2252

## 2022-09-21 NOTE — ED Triage Notes (Signed)
Pt has sore throat for 2 days now but states it is better.  States throat still feels swollen.  Nausea and weak feeling

## 2022-09-22 ENCOUNTER — Telehealth: Payer: Self-pay

## 2022-09-22 NOTE — Transitions of Care (Post Inpatient/ED Visit) (Signed)
   09/22/2022  Name: Sharon Baxter MRN: [id number] DOB: 06/22/1996  Today's TOC FU Call Status: Today's TOC FU Call Status:: Unsuccessul Call (1st Attempt) Unsuccessful Call (1st Attempt) Date: 09/22/22  Attempted to reach the patient regarding the most recent Inpatient/ED visit.  Follow Up Plan: Additional outreach attempts will be made to reach the patient to complete the Transitions of Care (Post Inpatient/ED visit) call.   Signature Juanda Crumble, [place] Direct Dial [phone]
